# Patient Record
Sex: Male | Born: 1995 | Race: Black or African American | Hispanic: No | Marital: Single | Smoking: Current every day smoker
Health system: Southern US, Community
[De-identification: ages and names within clinical notes are randomized; demographics above are authoritative.]

## PROBLEM LIST (undated history)

## (undated) DIAGNOSIS — J189 Pneumonia, unspecified organism: Secondary | ICD-10-CM

## (undated) DIAGNOSIS — J45909 Unspecified asthma, uncomplicated: Secondary | ICD-10-CM

---
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
## 2014-04-13 ENCOUNTER — Encounter (HOSPITAL_COMMUNITY): Payer: Self-pay | Admitting: Neurology

## 2014-04-13 ENCOUNTER — Emergency Department (HOSPITAL_COMMUNITY)
Admission: EM | Admit: 2014-04-13 | Discharge: 2014-04-13 | Disposition: A | Discharge status: Home / Self Care | Payer: Commercial Indemnity | Attending: Emergency Medicine | Admitting: Emergency Medicine

## 2014-04-13 ENCOUNTER — Emergency Department (HOSPITAL_COMMUNITY): Payer: Commercial Indemnity

## 2014-04-13 DIAGNOSIS — Z720 Tobacco use: Secondary | ICD-10-CM | POA: Insufficient documentation

## 2014-04-13 DIAGNOSIS — Z711 Person with feared health complaint in whom no diagnosis is made: Secondary | ICD-10-CM

## 2014-04-13 DIAGNOSIS — IMO0002 Reserved for concepts with insufficient information to code with codable children: Secondary | ICD-10-CM

## 2014-04-13 DIAGNOSIS — Z0000 Encounter for general adult medical examination without abnormal findings: Secondary | ICD-10-CM | POA: Insufficient documentation

## 2014-04-13 DIAGNOSIS — R229 Localized swelling, mass and lump, unspecified: Secondary | ICD-10-CM

## 2014-04-13 DIAGNOSIS — L729 Follicular cyst of the skin and subcutaneous tissue, unspecified: Secondary | ICD-10-CM | POA: Diagnosis present

## 2014-04-13 LAB — TSH: TSH: 1.25 u[IU]/mL (ref 0.350–4.500)

## 2014-04-13 NOTE — ED Provider Notes (Signed)
CSN: [id number]     Arrival date & time 04/13/14  1123 History  This chart was scribed for non-physician practitioner, Earley FavorGail Jashley Yellin, NP, working with No att. providers found by Charline BillsEssence Howell, ED Scribe. This patient was seen in room TR08C/TR08C and the patient's care was started at 11:35 AM.   Chief Complaint  Patient presents with  . Cyst   The history is provided by the patient. No language interpreter was used.   HPI Comments: Jon Neal is a 19 y.o. male who presents to the Emergency Department complaining of a non-tender knot under his adam's apple that he recently noticed 2 days ago. 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 He expresses concern of thyroid cancer. Pt denies sore throat, difficulty swallowing.  No PCP  History reviewed. No pertinent past medical history. History reviewed. No pertinent past surgical history. No family history on file. History  Substance Use Topics  . Smoking status: Current Every Day Smoker  . Smokeless tobacco: Not on file  . Alcohol Use: No    Review of Systems  HENT: Negative for sore throat and trouble swallowing.    Allergies  Review of patient's allergies indicates no known allergies.  Home Medications   Prior to Admission medications   Not on File   BP 139/78 mmHg  Pulse 70  Temp(Src) 98.3 F (36.8 C) (Oral)  Resp 16  Ht 5\' 10"  (1.778 m)  Wt 185 lb (83.915 kg)  BMI 26.54 kg/m2  SpO2 99% Physical Exam  Constitutional: He is oriented to person, place, and time. He appears well-developed and well-nourished. No distress.  HENT:  Head: Normocephalic and atraumatic.  Eyes: Conjunctivae and EOM are normal.  Neck: Neck supple. No tracheal deviation present. No thyroid mass present.  Tender thyroid notch.   Cardiovascular: Normal rate.   Pulmonary/Chest: Effort normal. No respiratory distress.  Musculoskeletal: Normal range of motion.  Neurological: He  is alert and oriented to person, place, and time.  Skin: Skin is warm and dry.  Psychiatric: He has a normal mood and affect. His behavior is normal.  Nursing note and vitals reviewed.  ED Course  Procedures (including critical care time) DIAGNOSTIC STUDIES: Oxygen Saturation is 99% on RA, normal by my interpretation.    COORDINATION OF CARE: 11:40 AM-Discussed treatment plan which includes TSH stat and US with pt at bedside and pt agreed to plan.   Labs Review Labs Reviewed  TSH   Imaging Review [place] Soft Tissue Head/neck  04/13/2014   CLINICAL DATA:  MASS X2 DAYS  EXAM: THYROID ULTRASOUND  TECHNIQUE: Ultrasound examination of the thyroid gland and adjacent soft tissues was performed.  COMPARISON:  None.  FINDINGS: Right thyroid lobe  Measurements: 48 x 17 x 18 mm.  No nodules visualized.  Left thyroid lobe  Measurements: 50 x 13 x 17 mm.  No nodules visualized.  Isthmus  Thickness: 2.3 mm.  No nodules visualized.  Lymphadenopathy  None visualized. No ultrasound correlate in the region of concern palpable per patient.  IMPRESSION: 1. Normal thyroid.   Electronically Signed   By: Corlis Leak  Hassell M.D.   On: 04/13/2014 12:41    EKG Interpretation None     Patient ultrasound and thyroid screen are normal.  Patient has been reassured, given a resource list to help him find a primary care physician MDM   Final diagnoses:  Physically well but worried  Earley Favor, NP 04/13/14 1628  Earley Favor, NP 04/13/14 1610  Purvis Sheffield, MD 04/14/14 1728

## 2014-04-13 NOTE — ED Notes (Signed)
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 Denies pain. No breathing difficulties.

## 2014-04-13 NOTE — Discharge Instructions (Signed)
Thyroid ultrasound is normal  Emergency Department Resource Guide 1) Find a Doctor and Pay Out of Pocket Although you won't have to find out who is covered by your insurance plan, it is a good idea to ask around and get recommendations. You will then need to call the office and see if the doctor you have chosen will accept you as a new patient and what types of options they offer for patients who are self-pay. Some doctors offer discounts or will set up payment plans for their patients who do not have insurance, but you will need to ask so you aren't surprised when you get to your appointment.  2) Contact Your Local Health Department Not all health departments have doctors that can see patients for sick visits, but many do, so it is worth a call to see if yours does. If you don't know where your local health department is, you can check in your phone book. The CDC also has a tool to help you locate your state's health department, and many state websites also have listings of all of their local health departments.  3) Find a Walk-in Clinic If your illness is not likely to be very severe or complicated, you may want to try a walk in clinic. These are popping up all over the country in pharmacies, drugstores, and shopping centers. They're usually staffed by nurse practitioners or physician assistants that have been trained to treat common illnesses and complaints. They're usually fairly quick and inexpensive. However, if you have serious medical issues or chronic medical problems, these are probably not your best option.  No Primary Care Doctor: - Call Health Connect at  [phone] - they can help you locate a primary care doctor that  accepts your insurance, provides certain services, etc. - Physician Referral Service- [phone]  Chronic Pain Problems: Organization         Address  Phone   Notes  [facility]  [phone] Patients need to be referred by their primary care  doctor.   Medication Assistance: Organization         Address  Phone   Notes  [facility] Medication [facility] [address] [place], [place] [address] [phone] --Must be a resident of [facility] -- Must have NO insurance coverage whatsoever (no Medicaid/ Medicare, etc.) -- The pt. MUST have a primary care doctor that directs their care regularly and follows them in the community   [facility]  [phone]   [employer]  [phone]    Agencies that provide inexpensive medical care: Organization         Address  Phone   Notes  [facility]  [phone]   [facility]    [phone]   [facility] [address] [place], [place] [address] [phone]   [facility] [address] [place] [place] [phone]   [facility]anned Parenthood    [phone]   [facility]    [phone]   [facility]  [address], [place] Phone:  [phone], Fax:  [phone] Hours of Operation:  9 am - 6 pm, M-F.  Also accepts Medicaid/Medicare and self-pay.  [facility]  [address], [place] Phone: [phone], Fax: [phone]. Hours of Operation:  8:30 am - 5:30 pm, M-F.  Also accepts Medicaid and self-pay.  [facility] [place] [address] [place] Phone: [phone]   [facility] [address] Natasha Bence [place], [place] [phone], Ext. [phone] Mondays & Thursdays: 7-9 AM.  First 15 patients are seen on a first come, first serve basis.    Medicaid-accepting [facility] Providers:  Organization         Address  Phone   Notes  [facility] [address], [place] [phone] Also accepts self-pay patients.  [facility] [address] Laurell Josephs [place], [place]  [phone]   [facility] [address], [place] [phone]   [facility] [address] [place] [phone]   Renaye Rakers [address], [place]   [phone] Only accepts [place] Access [place] patients after they have their name applied to their card.   Self-Pay (no insurance) in [facility]:  Organization         Address  Phone   Notes  Sickle Cell Patients, [facility] [address] [place], [place] [phone]   [facility] [address] [place], [place] [phone]   [facility]  [address], [place] [phone]   [facility]/Dr. Osei-Bonsu  [address], [place] or [address], [place] [phone] Phone number for both [place] and [place] locations is the same.  [facility] [address] [place] [phone]   [facility] [address] [place] or [address] [phone]   [facility] [address], [place] [phone], phone; [phone], fax Sees patients 1st and 3rd Saturday of every month.  Must not qualify for public or private insurance (i.e. Medicaid, Medicare, [place] Health Choice, Veterans' Benefits)  Household income should be no more than 200% of the poverty level The clinic cannot treat you if you are pregnant or think you are pregnant  Sexually transmitted diseases are not treated at the clinic.    Dental Care: Organization         Address  Phone  Notes  [facility] Department of [facility] [address] [place], [place] [phone] Accepts children up to age 105 who are enrolled in [place] or [place] Health Choice; pregnant women with a Medicaid card; and children who have applied for Medicaid or [place] Health Choice, but were declined, whose parents can pay a reduced fee at time of  service.  [facility] of [facility]  [address], [place] [phone] Accepts children up to age 13 who are enrolled in [place] or [place] Health Choice; pregnant women with a Medicaid card; and children who have applied for Medicaid or [place] Health Choice, but were declined, whose parents can pay a reduced fee at time of service.  [facility]  [address] [place], [place] [phone] Patients are seen by appointment only. Walk-ins are not accepted. [facility] will see patients 54 years of age and older. Monday - Tuesday (8am-5pm) Most Wednesdays (8:30-5pm) $30 per visit, cash only  [facility]  [address], [facility] [phone] Patients are seen by appointment only. Walk-ins are not accepted. [facility] will see patients 18 years of  age and older. One Wednesday Evening (Monthly: Volunteer Based).  $30 per visit, cash only  [employer]  [phone] for adults; Children under age 904, call Graduate Pediatric Dentistry at [phone]. Children aged 584-14, please call [phone] to request a pediatric application.  Dental services are provided in all areas of dental care including fillings, crowns and bridges, complete and partial dentures, implants, gum treatment, root canals, and extractions. Preventive care is also provided. Treatment is provided to both adults and children. Patients are selected via a lottery and there is often a waiting list.   [facility] [address], [place]  [phone] [email or web address]   [facility] [address], [place], [place] [phone], Ext. [phone] Second and Fourth Thursday of each month, opens at 6:30 AM; Clinic ends at 9 AM.  Patients are seen on a first-come first-served basis, and a limited number are seen during each clinic.   [facility]  [address] [place] [address], [place],  [place] [phone]   Eligibility Requirements You must have lived in [place], [place], or [place] counties for at least the last three months.   You cannot be eligible for state or federal sponsored [place]healthcare insurance, including CIGNAVeterans Administration, [place]icaid, or [employer]icare.   You generally cannot be eligible for healthcare insurance through your employer.    How to apply: Eligibility screenings are held every Tuesday and Wednesday afternoon from 1:00 pm until 4:00 pm. You do not need an appointment for the interview!  [facility] [address], [place], [place] [phone]   [facility]  [phone]   [facility]  [phone]   [facility]  [phone]    Behavioral Health Resources in the Community: Intensive Outpatient Programs Organization         Address  Phone  Notes  [facility] [address], [place], [place] [phone]   [facility] [address], [place], [place] [phone]   [facility] [address], [place], [place]  [phone]   [facility] [address],  [place], [place] [phone] or [phone]   Substance Abuse Resources Organization         Address  Phone  Notes  [facility]  [phone]   [facility]  [phone]   [facility]  [phone]   [facility]  [phone]   Residential & Outpatient Substance Abuse Program  [phone]   Psychological Services Organization         Address  Phone  Notes  [facility]  [phone]- [phone]   [facility]  [phone]- [phone]   [facility] [address], [place] [phone] or [phone]    Mobile Crisis Teams Organization         Address  Phone  Notes  [facility], Mobile Crisis Care Unit  [phone]   [facility]  [address] [place], [place] [phone]   Doristine LocksSharon DeEsch [address] [place] [phone]    Self-Help/Support Groups Organization         Address  Phone             Notes  [facility] [place] - variety of support groups  336- [phone] Call for more information  Narcotics Anonymous (NA), [facility] [address], [place]  2  meetings at this location   Residential Treatment Programs Organization         Address  Phone  Notes  [facility] [address]    [place]  [phone]   [facility]  [address] [place] [address], [place], [place] [phone]   [facility] [address] [place], [place] [phone] Admissions: 8am-3pm M-F  [facility] [address]    [place], [place] [phone]   [facility] [address] [place], [place], [place] [phone]   [facility] [address]  [place], [place] [phone]   [facility] - Intensive Outpatient [address] Joseph[address], [place], [place] [phone]   [facility] ([facility]iction [facility] [address]  [place], [place] [phone] or [phone]   [facility] ([facility]) [address], [place], [place] [phone] Accepts Medicaid  [facility] [address]  [place] [phone] Substance Abuse/Addiction Treatment   [facility] Organization         Address  Phone  Notes  [facility]  [phone]   Angie Fava, PhD [address] Ervin Knack [place], [place]   [phone] or [phone]   [facility] Behavioral   [address] [place], [place] [phone]   [facility] [address], [place], [place] [phone] Insurance/Medicaid/sponsorship through [facility] and Families [address]                                    [place], [place] [phone]  Therapy/tele-psych/case  [facility] [address], [place] [phone]    Dr. Lolly Mustache  [phone]   [facility]  [employer] [facility]. 1) [address] [place] 2) [address] [place] 3)  [address], [place] [phone]  [phone]   [facility] Child Abuse Hotline [phone] or [phone] (After Hours)

## 2016-03-18 IMAGING — US US SOFT TISSUE HEAD/NECK
1 series · 14 of 21 positions shown · non-contrast
Comparison: None.

CLINICAL DATA: MASS X2 DAYS

EXAM:
THYROID ULTRASOUND
TECHNIQUE: Ultrasound examination of the thyroid gland and adjacent soft
tissues was performed.

[Series 1: us soft tissue head/neck · 0.07mm/px · 14 of 21 slices shown]
[im 1/21]
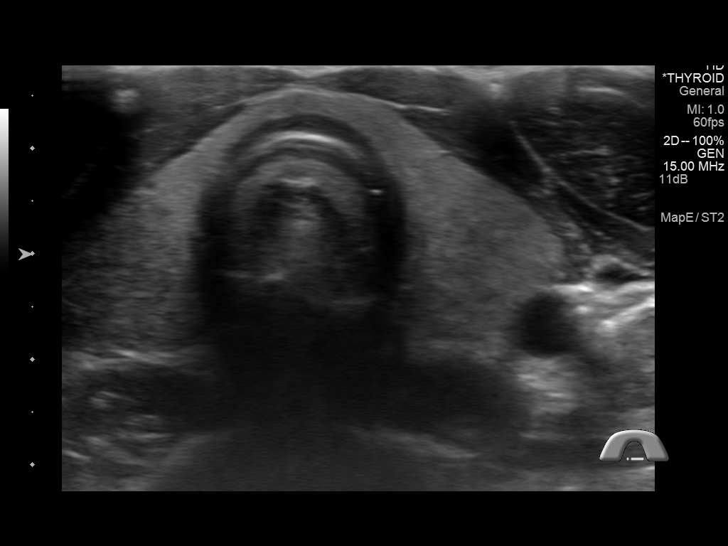
[im 3/21]
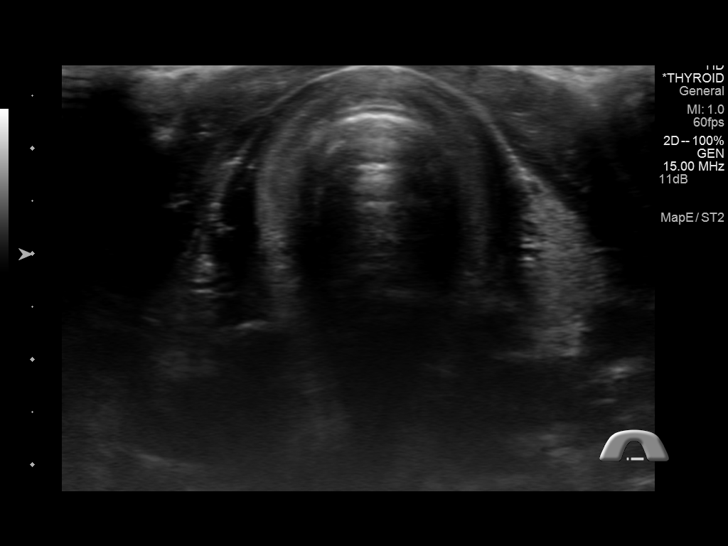
[im 4/21]
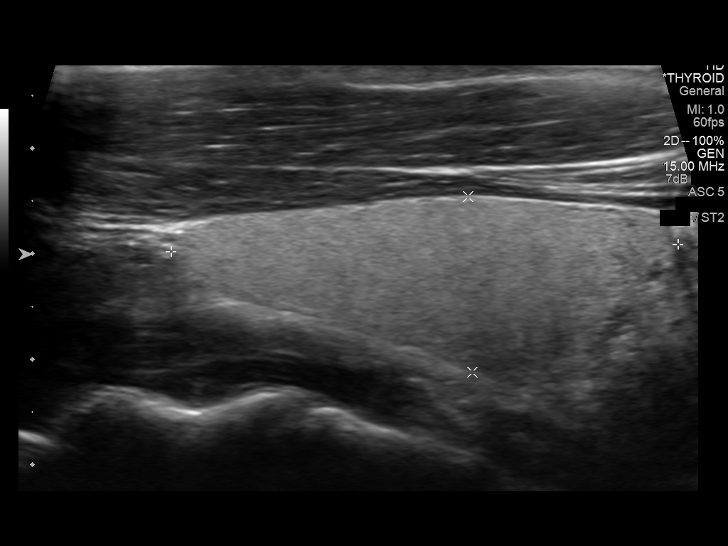
[im 6/21]
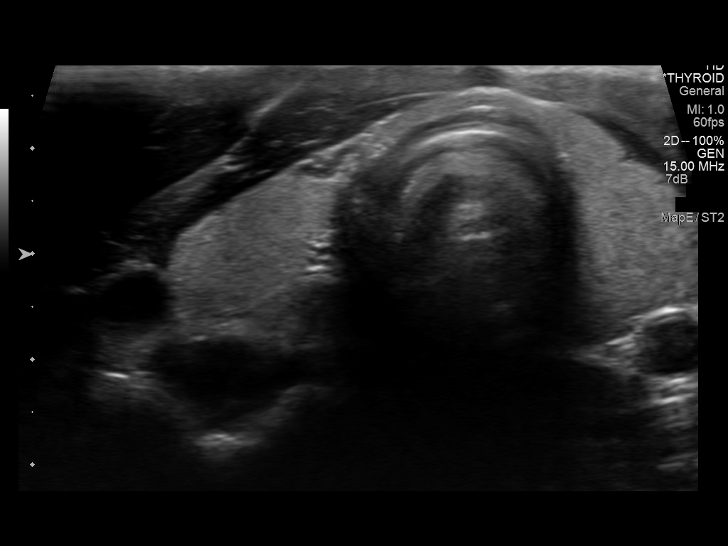
[im 7/21]
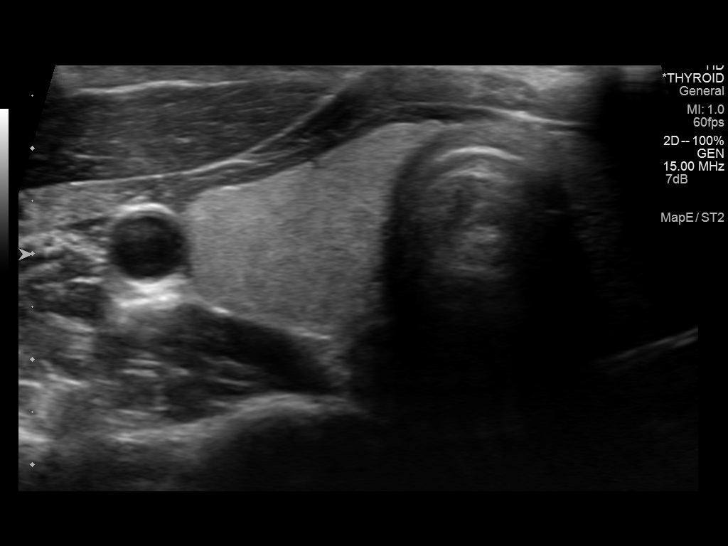
[im 9/21]
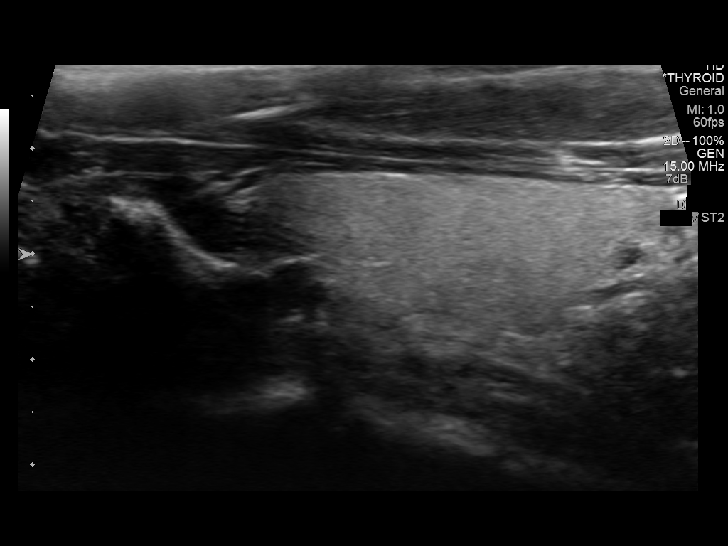
[im 10/21]
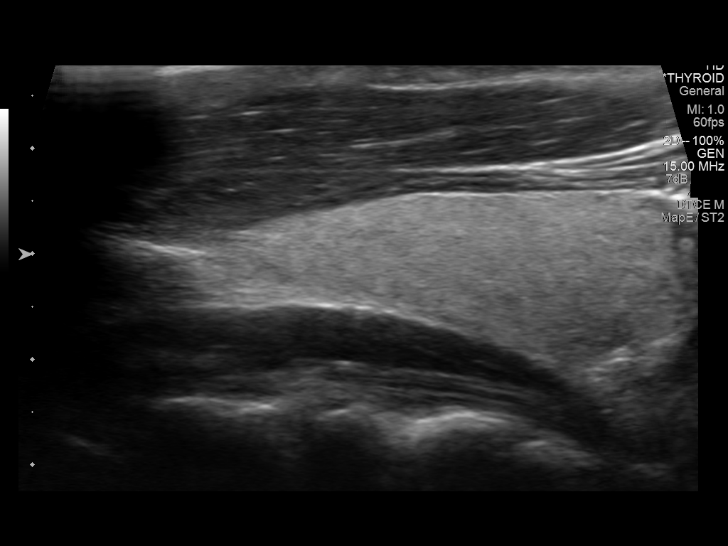
[im 12/21]
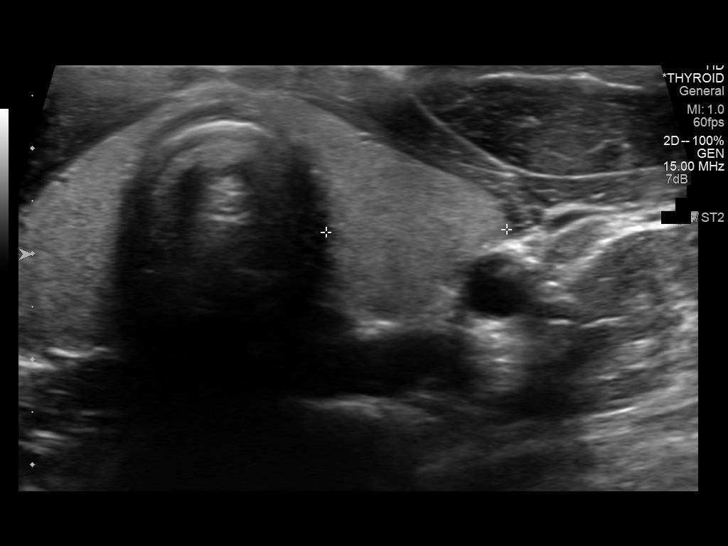
[im 13/21]
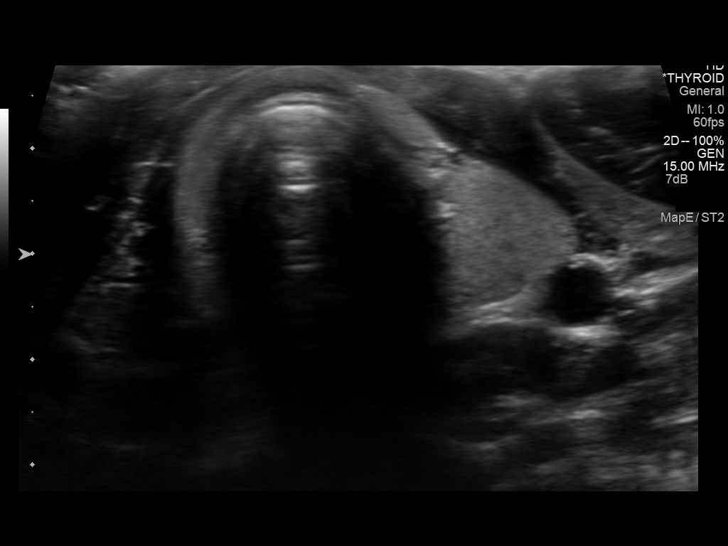
[im 15/21]
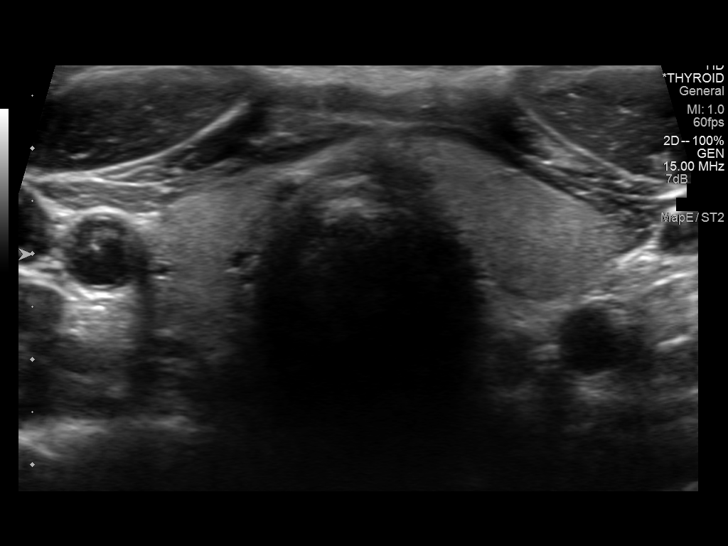
[im 16/21]
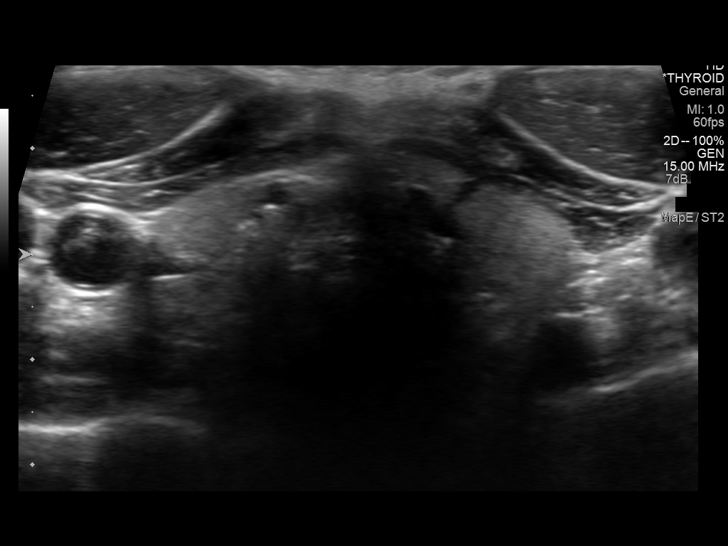
[im 18/21]
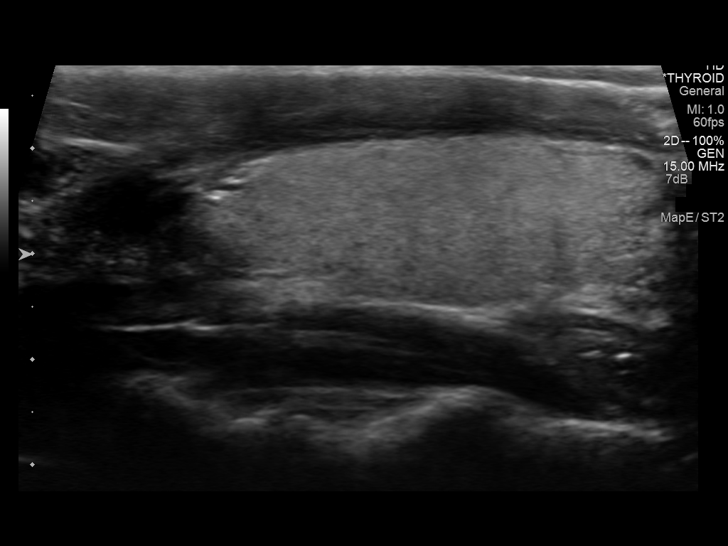
[im 19/21]
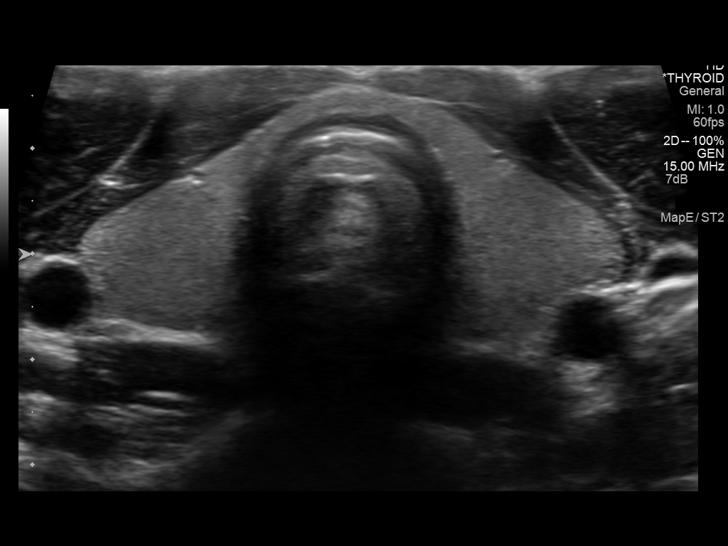
[im 21/21]
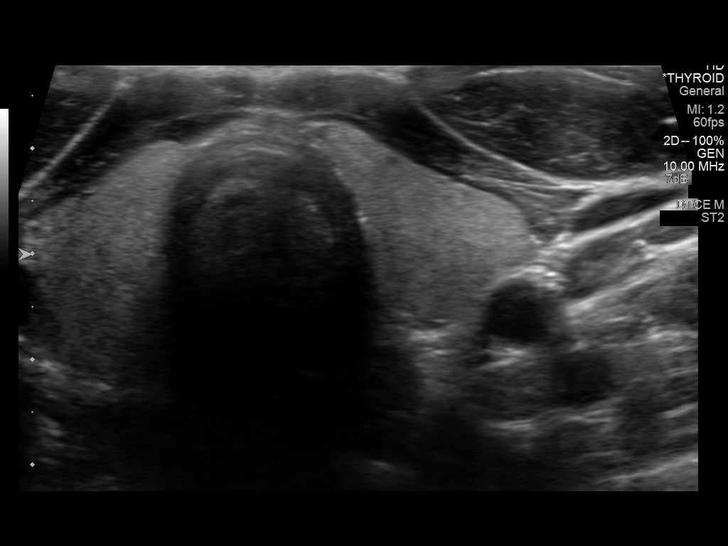

[14 of 21 positions shown; findings below may reference images not displayed]

FINDINGS: Right thyroid lobe

Measurements: 48 x 17 x 18 mm.  No nodules visualized.

Left thyroid lobe

Measurements: 50 x 13 x 17 mm.  No nodules visualized.

Isthmus

Thickness: 2.3 mm.  No nodules visualized.

Lymphadenopathy

None visualized. No ultrasound correlate in the region of concern
palpable per patient.
IMPRESSION: 1. Normal thyroid.

## 2016-11-18 ENCOUNTER — Encounter (HOSPITAL_BASED_OUTPATIENT_CLINIC_OR_DEPARTMENT_OTHER): Payer: Self-pay | Admitting: Emergency Medicine

## 2016-11-18 ENCOUNTER — Emergency Department (HOSPITAL_BASED_OUTPATIENT_CLINIC_OR_DEPARTMENT_OTHER)
Admission: EM | Admit: 2016-11-18 | Discharge: 2016-11-19 | Disposition: A | Discharge status: Home / Self Care | Payer: Managed Care, Other (non HMO) | Attending: Emergency Medicine | Admitting: Emergency Medicine

## 2016-11-18 DIAGNOSIS — R0981 Nasal congestion: Secondary | ICD-10-CM | POA: Diagnosis present

## 2016-11-18 DIAGNOSIS — J00 Acute nasopharyngitis [common cold]: Secondary | ICD-10-CM | POA: Diagnosis not present

## 2016-11-18 DIAGNOSIS — J209 Acute bronchitis, unspecified: Secondary | ICD-10-CM | POA: Diagnosis not present

## 2016-11-18 DIAGNOSIS — F17200 Nicotine dependence, unspecified, uncomplicated: Secondary | ICD-10-CM | POA: Insufficient documentation

## 2016-11-18 HISTORY — DX: Pneumonia, unspecified organism: J18.9

## 2016-11-18 MED ORDER — ALBUTEROL SULFATE (2.5 MG/3ML) 0.083% IN NEBU
5.0000 mg | INHALATION_SOLUTION | Freq: Once | RESPIRATORY_TRACT | Status: AC
  Administered 2016-11-18: 5 mg via RESPIRATORY_TRACT
  Filled 2016-11-18: qty 6

## 2016-11-18 MED ORDER — IPRATROPIUM BROMIDE 0.02 % IN SOLN
0.5000 mg | Freq: Once | RESPIRATORY_TRACT | Status: AC
  Administered 2016-11-18: 0.5 mg via RESPIRATORY_TRACT
  Filled 2016-11-18: qty 2.5

## 2016-11-18 NOTE — ED Notes (Signed)
ED Provider at bedside. 

## 2016-11-18 NOTE — Progress Notes (Signed)
RN gave nebulizer treatment. ?

## 2016-11-18 NOTE — ED Triage Notes (Signed)
Pt c/o nasal congestion and cough since yesterday.

## 2016-11-18 NOTE — ED Provider Notes (Signed)
[facility] EMERGENCY DEPARTMENT Provider Note   CSN: [id number] Arrival date & time: 11/18/16  2038     History   Chief Complaint Chief Complaint  Patient presents with  . Nasal Congestion    HPI Jon Neal is a 21 y.o. male.  Jon Neal is a 21 y.o. Male who presents to the emergency department complaining of sneezing, nasal congestion, postnasal drip, wheezing and a slight cough since yesterday.  Patient denies any fevers.  He reports his symptoms began yesterday with trouble breathing through his nose.  No trouble swallowing.  No fevers.  He does have a history of asthma, but does not have an albuterol inhaler any longer.  He has not taken anything for treatment of his symptoms today.  He did try plugging his nose with tissue due to it being very runny.  He denies fevers, chest pain, palpitations, abdominal pain, nausea, vomiting, trouble swallowing, trouble breathing, urinary symptoms, neck pain or rashes.   The history is provided by the patient and medical records. No language interpreter was used.    Past Medical History:  Diagnosis Date  . Pneumonia     There are no active problems to display for this patient.   History reviewed. No pertinent surgical history.     Home Medications    Prior to Admission medications   Medication Sig Start Date End Date Taking? Authorizing Provider  albuterol (PROVENTIL HFA;VENTOLIN HFA) 108 (90 Base) MCG/ACT inhaler Inhale 2 puffs into the lungs every 4 (four) hours as needed for wheezing or shortness of breath. 11/19/16   Everlene Farrier, PA-C  cetirizine (ZYRTEC ALLERGY) 10 MG tablet Take 1 tablet (10 mg total) by mouth daily. 11/19/16   Everlene Farrier, PA-C  fluticasone (FLONASE) 50 MCG/ACT nasal spray Place 2 sprays into both nostrils daily. 11/19/16   Everlene Farrier, PA-C  naproxen (NAPROSYN) 250 MG tablet Take 1 tablet (250 mg total) by mouth 2 (two) times daily with a meal. 11/19/16   Everlene Farrier, PA-C  predniSONE (DELTASONE) 20 MG tablet Take 2 tablets (40 mg total) by mouth daily. 11/19/16   Everlene Farrier, PA-C    Family History No family history on file.  Social History Social History  Substance Use Topics  . Smoking status: Current Every Day Smoker  . Smokeless tobacco: Never Used  . Alcohol use Yes     Comment: weekly     Allergies   Patient has no known allergies.   Review of Systems Review of Systems  Constitutional: Negative for chills and fever.  HENT: Positive for congestion, postnasal drip, rhinorrhea and sneezing. Negative for ear pain, facial swelling, sore throat and trouble swallowing.   Eyes: Negative for visual disturbance.  Respiratory: Positive for cough and wheezing. Negative for shortness of breath.   Cardiovascular: Negative for chest pain and palpitations.  Gastrointestinal: Negative for abdominal pain, nausea and vomiting.  Musculoskeletal: Negative for myalgias, neck pain and neck stiffness.  Skin: Negative for rash and wound.  Neurological: Negative for light-headedness and headaches.     Physical Exam Updated Vital Signs BP (!) 128/91   Pulse 68   Temp 98.9 F (37.2 C) (Oral)   Resp 18   Ht 5\' 10"  (1.778 m)   Wt 83.9 kg (185 lb)   SpO2 99%   BMI 26.54 kg/m   Physical Exam  Constitutional: He appears well-developed and well-nourished. No distress.  Nontoxic-appearing.  HENT:  Head: Normocephalic and atraumatic.  Right Ear: External ear normal.  Left Ear: External ear normal.  Mouth/Throat: Oropharynx is clear and moist. No oropharyngeal exudate.  No TM erythema or loss of landmarks bilaterally. Boggy nasal turbinates and rhinorrhea present bilaterally. Throat is clear.  No tonsillar hypertrophy or exudates.  Uvula is midline without edema.  Eyes: Pupils are equal, round, and reactive to light. Conjunctivae are normal. Right eye exhibits no discharge. Left eye exhibits no discharge.  Neck: Normal range of motion.  Neck supple. No JVD present. No tracheal deviation present.  Cardiovascular: Normal rate, regular rhythm, normal heart sounds and intact distal pulses.  Exam reveals no gallop and no friction rub.   No murmur heard. Pulmonary/Chest: Effort normal. No stridor. No respiratory distress. He has wheezes. He has no rales.  Mild inspiratory and expiratory wheezes noted bilaterally.  No increased work of breathing.  No rales or rhonchi.  Abdominal: Soft. There is no tenderness.  Musculoskeletal: He exhibits no edema.  Lymphadenopathy:    He has no cervical adenopathy.  Neurological: He is alert. Coordination normal.  Skin: Skin is warm and dry. No rash noted. He is not diaphoretic. No erythema. No pallor.  Psychiatric: He has a normal mood and affect. His behavior is normal.  Nursing note and vitals reviewed.    ED Treatments / Results  Labs (all labs ordered are listed, but only abnormal results are displayed) Labs Reviewed - No data to display  EKG  EKG Interpretation None       Radiology No results found.  Procedures Procedures (including critical care time)  Medications Ordered in ED Medications  albuterol (PROVENTIL) (2.5 MG/3ML) 0.083% nebulizer solution 5 mg (5 mg Nebulization Given 11/18/16 2343)  ipratropium (ATROVENT) nebulizer solution 0.5 mg (0.5 mg Nebulization Given 11/18/16 2344)  albuterol (PROVENTIL HFA;VENTOLIN HFA) 108 (90 Base) MCG/ACT inhaler 2 puff (2 puffs Inhalation Given 11/19/16 0036)  AEROCHAMBER PLUS FLO-VU LARGE MISC 1 each (1 each Other Given 11/19/16 0036)     Initial Impression / Assessment and Plan / ED Course  I have reviewed the triage vital signs and the nursing notes.  Pertinent labs & imaging results that were available during my care of the patient were reviewed by me and considered in my medical decision making (see chart for details).    This  is a 21 y.o. Male who presents to the emergency department complaining of sneezing, nasal  congestion, postnasal drip, wheezing and a slight cough since yesterday.  Patient denies any fevers.  He reports his symptoms began yesterday with trouble breathing through his nose.  No trouble swallowing.  No fevers.  He does have a history of asthma, but does not have an albuterol inhaler any longer. On exam the patient is afebrile nontoxic-appearing.  He has rhinorrhea and boggy nasal turbinates bilaterally.  Throat is clear.  He has mild inspiratory next Tory wheezes noted bilaterally.  He has a history of asthma.  Will provide with breathing treatment and reevaluate. At reevaluation patient's wheezing has resolved.  Lung sounds are clear at reevaluation.  He tells me he is feeling much better.  Still having nasal congestion.  Will provide with an albuterol inhaler and spacer at discharge.  Will discharge with prescriptions for albuterol inhaler, a 5-day burst of prednisone, Flonase, Zyrtec and naproxen.  No evidence of pneumonia on exam.  I advised that if he develops fevers, trouble breathing or new or worsening symptoms he needs to return to the emergency department. I advised the patient to follow-up with their primary care provider this  week. I advised the patient to return to the emergency department with new or worsening symptoms or new concerns. The patient verbalized understanding and agreement with plan.     Final Clinical Impressions(s) / ED Diagnoses   Final diagnoses:  Acute bronchitis, unspecified organism  Acute nasopharyngitis    New Prescriptions Discharge Medication List as of 11/19/2016 12:32 AM    START taking these medications   Details  albuterol (PROVENTIL HFA;VENTOLIN HFA) 108 (90 Base) MCG/ACT inhaler Inhale 2 puffs into the lungs every 4 (four) hours as needed for wheezing or shortness of breath., Starting Wed 11/19/2016, Print    cetirizine (ZYRTEC ALLERGY) 10 MG tablet Take 1 tablet (10 mg total) by mouth daily., Starting Wed 11/19/2016, Print    fluticasone  (FLONASE) 50 MCG/ACT nasal spray Place 2 sprays into both nostrils daily., Starting Wed 11/19/2016, Print    naproxen (NAPROSYN) 250 MG tablet Take 1 tablet (250 mg total) by mouth 2 (two) times daily with a meal., Starting Wed 11/19/2016, Print    predniSONE (DELTASONE) 20 MG tablet Take 2 tablets (40 mg total) by mouth daily., Starting Wed 11/19/2016, Print         Everlene Farrier, PA-C 11/19/16 0157    Ward, Layla Maw, DO 11/19/16 [phone]

## 2016-11-19 MED ORDER — ALBUTEROL SULFATE HFA 108 (90 BASE) MCG/ACT IN AERS
2.0000 | INHALATION_SPRAY | Freq: Once | RESPIRATORY_TRACT | Status: AC
  Administered 2016-11-19: 2 via RESPIRATORY_TRACT
  Filled 2016-11-19: qty 6.7

## 2016-11-19 MED ORDER — AEROCHAMBER PLUS FLO-VU LARGE MISC
1.0000 | Freq: Once | Status: AC
  Administered 2016-11-19: 1
  Filled 2016-11-19: qty 1

## 2016-11-19 MED ORDER — NAPROXEN 250 MG PO TABS: 250.0000 mg | ORAL_TABLET | Freq: Two times a day (BID) | ORAL | 0 refills | Status: DC

## 2016-11-19 MED ORDER — CETIRIZINE HCL 10 MG PO TABS: 10.0000 mg | ORAL_TABLET | Freq: Every day | ORAL | 1 refills | Status: AC

## 2016-11-19 MED ORDER — FLUTICASONE PROPIONATE 50 MCG/ACT NA SUSP: 2.0000 | Freq: Every day | NASAL | 0 refills | Status: AC

## 2016-11-19 MED ORDER — ALBUTEROL SULFATE HFA 108 (90 BASE) MCG/ACT IN AERS: 2.0000 | INHALATION_SPRAY | RESPIRATORY_TRACT | 0 refills | Status: AC | PRN

## 2016-11-19 MED ORDER — PREDNISONE 20 MG PO TABS: 40.0000 mg | ORAL_TABLET | Freq: Every day | ORAL | 0 refills | Status: AC

## 2022-03-24 ENCOUNTER — Other Ambulatory Visit: Payer: Self-pay

## 2022-03-24 ENCOUNTER — Emergency Department (HOSPITAL_COMMUNITY): Payer: Managed Care, Other (non HMO)

## 2022-03-24 ENCOUNTER — Emergency Department (HOSPITAL_COMMUNITY)
Admission: EM | Admit: 2022-03-24 | Discharge: 2022-03-24 | Disposition: A | Discharge status: Home / Self Care | Payer: Managed Care, Other (non HMO) | Attending: Emergency Medicine | Admitting: Emergency Medicine

## 2022-03-24 ENCOUNTER — Encounter (HOSPITAL_COMMUNITY): Payer: Self-pay

## 2022-03-24 DIAGNOSIS — J45909 Unspecified asthma, uncomplicated: Secondary | ICD-10-CM | POA: Insufficient documentation

## 2022-03-24 DIAGNOSIS — Z7951 Long term (current) use of inhaled steroids: Secondary | ICD-10-CM | POA: Insufficient documentation

## 2022-03-24 DIAGNOSIS — M546 Pain in thoracic spine: Secondary | ICD-10-CM

## 2022-03-24 HISTORY — DX: Unspecified asthma, uncomplicated: J45.909

## 2022-03-24 LAB — CBC WITH DIFFERENTIAL/PLATELET
Abs Immature Granulocytes: 0.01 10*3/uL (ref 0.00–0.07)
Basophils Absolute: 0 10*3/uL (ref 0.0–0.1)
Basophils Relative: 1 %
Eosinophils Absolute: 0.1 10*3/uL (ref 0.0–0.5)
Eosinophils Relative: 3 %
HCT: 45.1 % (ref 39.0–52.0)
Hemoglobin: 15 g/dL (ref 13.0–17.0)
Immature Granulocytes: 0 %
Lymphocytes Relative: 26 %
Lymphs Abs: 1.4 10*3/uL (ref 0.7–4.0)
MCH: 31.5 pg (ref 26.0–34.0)
MCHC: 33.3 g/dL (ref 30.0–36.0)
MCV: 94.7 fL (ref 80.0–100.0)
Monocytes Absolute: 0.6 10*3/uL (ref 0.1–1.0)
Monocytes Relative: 11 %
Neutro Abs: 3.1 10*3/uL (ref 1.7–7.7)
Neutrophils Relative %: 59 %
Platelets: 180 10*3/uL (ref 150–400)
RBC: 4.76 MIL/uL (ref 4.22–5.81)
RDW: 12.8 % (ref 11.5–15.5)
WBC: 5.2 10*3/uL (ref 4.0–10.5)
nRBC: 0 % (ref 0.0–0.2)

## 2022-03-24 LAB — URINALYSIS, ROUTINE W REFLEX MICROSCOPIC
Bilirubin Urine: NEGATIVE
Glucose, UA: NEGATIVE mg/dL
Hgb urine dipstick: NEGATIVE
Ketones, ur: NEGATIVE mg/dL
Leukocytes,Ua: NEGATIVE
Nitrite: NEGATIVE
Protein, ur: NEGATIVE mg/dL
Specific Gravity, Urine: 1.017 (ref 1.005–1.030)
pH: 7 (ref 5.0–8.0)

## 2022-03-24 LAB — COMPREHENSIVE METABOLIC PANEL
ALT: 26 U/L (ref 0–44)
AST: 23 U/L (ref 15–41)
Albumin: 4.7 g/dL (ref 3.5–5.0)
Alkaline Phosphatase: 53 U/L (ref 38–126)
Anion gap: 7 (ref 5–15)
BUN: 18 mg/dL (ref 6–20)
CO2: 25 mmol/L (ref 22–32)
Calcium: 8.9 mg/dL (ref 8.9–10.3)
Chloride: 104 mmol/L (ref 98–111)
Creatinine, Ser: 1.23 mg/dL (ref 0.61–1.24)
GFR, Estimated: >60 mL/min (ref >60)
Glucose, Bld: 101 mg/dL — ABNORMAL HIGH (ref 70–99)
Potassium: 4.1 mmol/L (ref 3.5–5.1)
Sodium: 136 mmol/L (ref 135–145)
Total Bilirubin: 0.8 mg/dL (ref 0.3–1.2)
Total Protein: 7.4 g/dL (ref 6.5–8.1)

## 2022-03-24 LAB — LIPASE, BLOOD: Lipase: 35 U/L (ref 11–51)

## 2022-03-24 MED ORDER — SODIUM CHLORIDE (PF) 0.9 % IJ SOLN
INTRAMUSCULAR | Status: AC
  Filled 2022-03-24: qty 50

## 2022-03-24 MED ORDER — NAPROXEN 500 MG PO TABS: 500.0000 mg | ORAL_TABLET | Freq: Two times a day (BID) | ORAL | 0 refills | Status: AC

## 2022-03-24 MED ORDER — IOHEXOL 300 MG/ML  SOLN
100.0000 mL | Freq: Once | INTRAMUSCULAR | Status: AC | PRN
  Administered 2022-03-24: 100 mL via INTRAVENOUS

## 2022-03-24 MED ORDER — CYCLOBENZAPRINE HCL 10 MG PO TABS: 10.0000 mg | ORAL_TABLET | Freq: Two times a day (BID) | ORAL | 0 refills | Status: AC | PRN

## 2022-03-24 NOTE — ED Provider Notes (Signed)
[name] EMERGENCY DEPARTMENT AT [facility] Provider Note   CSN: [id number] Arrival date & time: 03/24/22  1131     History  Chief Complaint  Patient presents with   Flank Pain    Jon Neal is a 27 y.o. male.  Patient is a 27 year old male with a history of asthma and daily alcohol use who is presenting today with complaints of right-sided flank/side pain that has been constant for the last 1 week.  He said prior to that over the last year he has maybe had 4 or 5 episodes that might last a day but never anything that is been this long.  It does seem worse with certain types of movement and if he takes a deep breath sometimes it will catch.  He has had a few loose stools in the last week but denies any nausea vomiting.  No urinary symptoms.  No trauma.  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  He has no numbness or tingling down his arms or legs.  He takes no medications regularly.  No prior abdominal surgeries.  No unilateral leg pain or swelling.  No history of DVT or PE.  He denies any shortness of breath or chest pain.  The history is provided by the patient.  Flank Pain       Home Medications Prior to Admission medications   Medication Sig Start Date End Date Taking? Authorizing Provider  cyclobenzaprine (FLEXERIL) 10 MG tablet Take 1 tablet (10 mg total) by mouth 2 (two) times daily as needed for muscle spasms. 03/24/22  Yes Blanchie Dessert, MD  naproxen (NAPROSYN) 500 MG tablet Take 1 tablet (500 mg total) by mouth 2 (two) times daily. 03/24/22  Yes Kerrianne Jeng, Loree Fee, MD  albuterol (PROVENTIL HFA;VENTOLIN HFA) 108 (90 Base) MCG/ACT inhaler Inhale 2 puffs into the lungs every 4 (four) hours as needed for wheezing or shortness of breath. 11/19/16   Waynetta Pean, PA-C  cetirizine (ZYRTEC ALLERGY) 10 MG tablet Take 1 tablet (10 mg total) by mouth daily. 11/19/16   Waynetta Pean, PA-C  fluticasone (FLONASE)  50 MCG/ACT nasal spray Place 2 sprays into both nostrils daily. 11/19/16   Waynetta Pean, PA-C  predniSONE (DELTASONE) 20 MG tablet Take 2 tablets (40 mg total) by mouth daily. 11/19/16   Waynetta Pean, PA-C      Allergies    Patient has no known allergies.    Review of Systems   Review of Systems  Genitourinary:  Positive for flank pain.    Physical Exam Updated Vital Signs BP 135/88 (BP Location: Left Arm)   Pulse 66   Temp 98 F (36.7 C) (Oral)   Resp 15   SpO2 100%  Physical Exam Vitals and nursing note reviewed.  Constitutional:      General: He is not in acute distress.    Appearance: He is well-developed.  HENT:     Head: Normocephalic and atraumatic.  Eyes:     Conjunctiva/sclera: Conjunctivae normal.     Pupils: Pupils are equal, round, and reactive to light.  Cardiovascular:     Rate and Rhythm: Normal rate and regular rhythm.     Heart sounds: No murmur heard. Pulmonary:     Effort: Pulmonary effort is normal. No respiratory distress.     Breath sounds: Normal breath sounds. No wheezing or rales.  Abdominal:     General: There is no distension.  Palpations: Abdomen is soft. There is no hepatomegaly.     Tenderness: There is no abdominal tenderness. There is right CVA tenderness. There is no guarding or rebound.  Musculoskeletal:        General: No tenderness. Normal range of motion.     Cervical back: Normal range of motion and neck supple.  Skin:    General: Skin is warm and dry.     Findings: No erythema or rash.  Neurological:     Mental Status: He is alert and oriented to person, place, and time.  Psychiatric:        Behavior: Behavior normal.     ED Results / Procedures / Treatments   Labs (all labs ordered are listed, but only abnormal results are displayed) Labs Reviewed  URINALYSIS, ROUTINE W REFLEX MICROSCOPIC - Abnormal; Notable for the following components:      Result Value   Color, Urine STRAW (*)    All other components  within normal limits  COMPREHENSIVE METABOLIC PANEL - Abnormal; Notable for the following components:   Glucose, Bld 101 (*)    All other components within normal limits  CBC WITH DIFFERENTIAL/PLATELET  LIPASE, BLOOD    EKG None  Radiology CT ABDOMEN PELVIS W CONTRAST  Result Date: 03/24/2022 CLINICAL DATA:  Abdominal pain, acute, nonlocalized. Left lower quadrant pain. EXAM: CT ABDOMEN AND PELVIS WITH CONTRAST TECHNIQUE: Multidetector CT imaging of the abdomen and pelvis was performed using the standard protocol following bolus administration of intravenous contrast. RADIATION DOSE REDUCTION: This exam was performed according to the departmental dose-optimization program which includes automated exposure control, adjustment of the mA and/or kV according to patient size and/or use of iterative reconstruction technique. CONTRAST:  19m OMNIPAQUE IOHEXOL 300 MG/ML  SOLN COMPARISON:  None Available. FINDINGS: Lower chest: 7 mm calcification in the right lower lobe is suggestive for a calcified granuloma. Otherwise, the visualized lung bases are clear. Hepatobiliary: Normal appearance of the liver, gallbladder and portal venous system. Pancreas: Unremarkable. No pancreatic ductal dilatation or surrounding inflammatory changes. Spleen: Normal in size without focal abnormality. Adrenals/Urinary Tract: Adrenal glands are unremarkable. Kidneys are normal, without renal calculi, focal lesion, or hydronephrosis. Bladder is unremarkable. Stomach/Bowel: Stomach is within normal limits. Appendix appears normal. No evidence of bowel wall thickening, distention, or inflammatory changes. Vascular/Lymphatic: No significant vascular findings are present. No enlarged abdominal or pelvic lymph nodes. Reproductive: Prostate is unremarkable. Other: Negative for free fluid. Negative for free air. Small periumbilical hernia containing fat. Musculoskeletal: Stranding with a calcification along the anterolateral right thigh on  image 104/2. This finding is nonspecific but could be associated with postoperative changes. No acute bone abnormality. IMPRESSION: 1. No acute abnormality in the abdomen or pelvis. 2. Question posttraumatic or postsurgical changes in the anterior right thigh. Electronically Signed   By: AMarkus DaftM.D.   On: 03/24/2022 14:07    Procedures Procedures    Medications Ordered in ED Medications  sodium chloride (PF) 0.9 % injection (has no administration in time range)  iohexol (OMNIPAQUE) 300 MG/ML solution 100 mL (100 mLs Intravenous Contrast Given 03/24/22 1348)    ED Course/ Medical Decision Making/ A&P                             Medical Decision Making Amount and/or Complexity of Data Reviewed Labs: ordered. Decision-making details documented in ED Course. Radiology: ordered and independent interpretation performed. Decision-making details documented in ED Course.  Risk  Prescription drug management.   Pt presenting today with a complaint that caries a high risk for morbidity and mortality.  1 week of right-sided flank and side pain.  Could be musculoskeletal versus renal pathology such as renal mass or kidney stone.  Low suspicion for pyelonephritis at this time.  No skin findings suggestive of zoster.  Low suspicion for pneumonia, PE.  Also concern for possible hepatomegaly as patient does drink daily.  He is otherwise well-appearing at this time.  Labs are pending.  3:03 PM I independently interpreted patient's labs which show normal CBC, UA, CMP and lipase.  I have independently visualized and interpreted pt's images today.  CT abdomen pelvis today without any evidence of renal masses, hydronephrosis or appendicitis.  Radiology reports this is normal.  Findings discussed with the patient.  At this time most likely musculoskeletal.  Will give follow-up and supportive care.  Patient stable for discharge at this time.          Final Clinical Impression(s) / ED Diagnoses Final  diagnoses:  Acute right-sided thoracic back pain    Rx / DC Orders ED Discharge Orders          Ordered    cyclobenzaprine (FLEXERIL) 10 MG tablet  2 times daily PRN        03/24/22 1503    naproxen (NAPROSYN) 500 MG tablet  2 times daily        03/24/22 1503              Blanchie Dessert, MD 03/24/22 1503

## 2022-03-24 NOTE — Discharge Instructions (Addendum)
In addition to the prescriptions you received you could also get Voltaren gel or lidocaine patches over-the-counter and put it in the area that it hurts.  Try some of the back exercises but other things or not improving follow-up for possible physical therapy.

## 2022-03-24 NOTE — ED Triage Notes (Signed)
Patient has had right sided flank/side pain for 1 week. No burning with urination. No vomiting. Some loose diarrhea.

## 2022-08-21 ENCOUNTER — Emergency Department (HOSPITAL_COMMUNITY)
Admission: EM | Admit: 2022-08-21 | Discharge: 2022-08-21 | Disposition: A | Discharge status: Home / Self Care | Payer: Self-pay | Attending: Emergency Medicine | Admitting: Emergency Medicine

## 2022-08-21 ENCOUNTER — Other Ambulatory Visit: Payer: Self-pay

## 2022-08-21 ENCOUNTER — Emergency Department (HOSPITAL_COMMUNITY): Payer: Self-pay

## 2022-08-21 ENCOUNTER — Encounter (HOSPITAL_COMMUNITY): Payer: Self-pay | Admitting: *Deleted

## 2022-08-21 DIAGNOSIS — Z20822 Contact with and (suspected) exposure to covid-19: Secondary | ICD-10-CM | POA: Insufficient documentation

## 2022-08-21 DIAGNOSIS — M545 Low back pain, unspecified: Secondary | ICD-10-CM | POA: Insufficient documentation

## 2022-08-21 DIAGNOSIS — F17210 Nicotine dependence, cigarettes, uncomplicated: Secondary | ICD-10-CM | POA: Insufficient documentation

## 2022-08-21 DIAGNOSIS — Z7951 Long term (current) use of inhaled steroids: Secondary | ICD-10-CM | POA: Insufficient documentation

## 2022-08-21 DIAGNOSIS — J45901 Unspecified asthma with (acute) exacerbation: Secondary | ICD-10-CM | POA: Insufficient documentation

## 2022-08-21 DIAGNOSIS — J069 Acute upper respiratory infection, unspecified: Secondary | ICD-10-CM | POA: Insufficient documentation

## 2022-08-21 LAB — SARS CORONAVIRUS 2 BY RT PCR: SARS Coronavirus 2 by RT PCR: NEGATIVE

## 2022-08-21 MED ORDER — PREDNISONE 10 MG PO TABS: 40.0000 mg | ORAL_TABLET | Freq: Every day | ORAL | 0 refills | Status: AC

## 2022-08-21 MED ORDER — IPRATROPIUM-ALBUTEROL 0.5-2.5 (3) MG/3ML IN SOLN
3.0000 mL | Freq: Once | RESPIRATORY_TRACT | Status: AC
  Administered 2022-08-21: 3 mL via RESPIRATORY_TRACT
  Filled 2022-08-21: qty 3

## 2022-08-21 MED ORDER — ALBUTEROL SULFATE HFA 108 (90 BASE) MCG/ACT IN AERS: 1.0000 | INHALATION_SPRAY | Freq: Four times a day (QID) | RESPIRATORY_TRACT | 3 refills | Status: AC | PRN

## 2022-08-21 MED ORDER — DEXAMETHASONE SODIUM PHOSPHATE 10 MG/ML IJ SOLN
6.0000 mg | Freq: Once | INTRAMUSCULAR | Status: AC
  Administered 2022-08-21: 6 mg via INTRAMUSCULAR
  Filled 2022-08-21: qty 1

## 2022-08-21 MED ORDER — KETOROLAC TROMETHAMINE 30 MG/ML IJ SOLN
30.0000 mg | Freq: Once | INTRAMUSCULAR | Status: AC
  Administered 2022-08-21: 30 mg via INTRAMUSCULAR
  Filled 2022-08-21: qty 1

## 2022-08-21 MED ORDER — ALBUTEROL SULFATE HFA 108 (90 BASE) MCG/ACT IN AERS
2.0000 | INHALATION_SPRAY | Freq: Once | RESPIRATORY_TRACT | Status: AC
  Administered 2022-08-21: 2 via RESPIRATORY_TRACT
  Filled 2022-08-21: qty 6.7

## 2022-08-21 NOTE — ED Provider Notes (Signed)
[place] EMERGENCY DEPARTMENT AT [facility] Provider Note   CSN: [id number] Arrival date & time: 08/21/22  4098     History  Chief Complaint  Patient presents with   Wheezing    Jon Neal is a 27 y.o. male presented to the ED with persistent coughing ongoing for 1-1/2 weeks.  Patient reports that he began with viral type syndrome with congestion, headaches, runny nose, and those symptoms have resolved but he continues coughing.  He went for work physical today and was told he needed to come to the ER because of his cough.  The patient smokes about half a pack of cigarettes a day.  He does have a childhood history of asthma but has not had asthma problems since then, and does not carry an inhaler with him regularly.  Reports he has had low midline back pain that began about 4 days ago, worse with his coughing and with ambulation, does not radiate down his legs  HPI     Home Medications Prior to Admission medications   Medication Sig Start Date End Date Taking? Authorizing Provider  albuterol (VENTOLIN HFA) 108 (90 Base) MCG/ACT inhaler Inhale 1-2 puffs into the lungs every 6 (six) hours as needed for wheezing or shortness of breath. 08/21/22  Yes Lynnell Fiumara, Kermit Balo, MD  predniSONE (DELTASONE) 10 MG tablet Take 4 tablets (40 mg total) by mouth daily with breakfast for 4 days. 08/22/22 08/26/22 Yes Lennin Osmond, Kermit Balo, MD  albuterol (PROVENTIL HFA;VENTOLIN HFA) 108 (90 Base) MCG/ACT inhaler Inhale 2 puffs into the lungs every 4 (four) hours as needed for wheezing or shortness of breath. 11/19/16   Everlene Farrier, PA-C  cetirizine (ZYRTEC ALLERGY) 10 MG tablet Take 1 tablet (10 mg total) by mouth daily. 11/19/16   Everlene Farrier, PA-C  cyclobenzaprine (FLEXERIL) 10 MG tablet Take 1 tablet (10 mg total) by mouth 2 (two) times daily as needed for muscle spasms. 03/24/22   Gwyneth Sprout, MD  fluticasone (FLONASE) 50 MCG/ACT nasal spray Place 2 sprays into both nostrils daily.  11/19/16   Everlene Farrier, PA-C  naproxen (NAPROSYN) 500 MG tablet Take 1 tablet (500 mg total) by mouth 2 (two) times daily. 03/24/22   Gwyneth Sprout, MD  predniSONE (DELTASONE) 20 MG tablet Take 2 tablets (40 mg total) by mouth daily. 11/19/16   Everlene Farrier, PA-C      Allergies    Patient has no known allergies.    Review of Systems   Review of Systems  Physical Exam Updated Vital Signs BP (!) 153/107 (BP Location: Right Arm)   Pulse 94   Temp 98.4 F (36.9 C) (Oral)   Resp 16   Ht 5\' 10"  (1.778 m)   Wt 99.8 kg   SpO2 100%   BMI 31.57 kg/m  Physical Exam Constitutional:      General: He is not in acute distress. HENT:     Head: Normocephalic and atraumatic.  Eyes:     Conjunctiva/sclera: Conjunctivae normal.     Pupils: Pupils are equal, round, and reactive to light.  Cardiovascular:     Rate and Rhythm: Normal rate and regular rhythm.  Pulmonary:     Effort: Pulmonary effort is normal. No respiratory distress.     Comments: Persistent coughing, poor air movement bilaterally Skin:    General: Skin is warm and dry.  Neurological:     General: No focal deficit present.     Mental Status: He is alert and oriented to person, place,  and time. Mental status is at baseline.  Psychiatric:        Mood and Affect: Mood normal.        Behavior: Behavior normal.     ED Results / Procedures / Treatments   Labs (all labs ordered are listed, but only abnormal results are displayed) Labs Reviewed  SARS CORONAVIRUS 2 BY RT PCR    EKG None  Radiology DG Chest 2 View  Result Date: 08/21/2022 CLINICAL DATA:  Persistent cough Evaluate for pneumonia EXAM: CHEST - 2 VIEW COMPARISON:  06/06/2003 FINDINGS: Cardiomediastinal silhouette and pulmonary vasculature are within normal limits. Lungs are clear. IMPRESSION: No acute cardiopulmonary process. Electronically Signed   By: Acquanetta Belling M.D.   On: 08/21/2022 11:20    Procedures Procedures    Medications Ordered in  ED Medications  albuterol (VENTOLIN HFA) 108 (90 Base) MCG/ACT inhaler 2 puff (has no administration in time range)  ipratropium-albuterol (DUONEB) 0.5-2.5 (3) MG/3ML nebulizer solution 3 mL (3 mLs Nebulization Given 08/21/22 1057)  ketorolac (TORADOL) 30 MG/ML injection 30 mg (30 mg Intramuscular Given 08/21/22 1057)  dexamethasone (DECADRON) injection 6 mg (6 mg Intramuscular Given 08/21/22 1057)    ED Course/ Medical Decision Making/ A&P                                 Medical Decision Making Amount and/or Complexity of Data Reviewed Radiology: ordered.  Risk Prescription drug management.   Patient is presenting with persistent coughing for about 10 days.  Began with viral type syndrome.  I suspect this is most likely postviral syndrome but now with 10 days of productive cough I think x-rays are reasonable to evaluate for pneumonia.  Patient was given DuoNebs due to his history of asthma and poor air movement.  He was also given IM Decadron both for potential reactive airway disease, as well as complaint of developing low back pain, in the midline, for the past 4 days I suspect may be related to lumbar pain from coughing  No red flags or cauda equina syndrome or indication for emergent MRI.  Doubt fracture of the lumbar spine.        Final Clinical Impression(s) / ED Diagnoses Final diagnoses:  Mild asthma with exacerbation, unspecified whether persistent  Viral URI with cough    Rx / DC Orders ED Discharge Orders          Ordered    predniSONE (DELTASONE) 10 MG tablet  Daily with breakfast        08/21/22 1317    albuterol (VENTOLIN HFA) 108 (90 Base) MCG/ACT inhaler  Every 6 hours PRN        08/21/22 1317              Terald Sleeper, MD 08/21/22 1318

## 2022-08-21 NOTE — ED Notes (Signed)
Pt noted to have wheezing in all fields, but it was more pronounced in the upper fields. He was c/o coughing on deep inhalation. No respiratory distress noted at this time. Pt had no additional complaints.

## 2022-08-21 NOTE — ED Triage Notes (Signed)
Wheezing for a few days after cold. Went for a physical and was told to come to the ED due to wheezing, c/o back pain.
